# Patient Record
Sex: Female | Born: 1965 | Race: White | Hispanic: No | Marital: Married | Smoking: Never smoker
Health system: Southern US, Community
[De-identification: ages and names within clinical notes are randomized; demographics above are authoritative.]

## PROBLEM LIST (undated history)

## (undated) DIAGNOSIS — F32A Depression, unspecified: Secondary | ICD-10-CM

## (undated) DIAGNOSIS — K219 Gastro-esophageal reflux disease without esophagitis: Secondary | ICD-10-CM

## (undated) DIAGNOSIS — Z86010 Personal history of colonic polyps: Secondary | ICD-10-CM

## (undated) DIAGNOSIS — F419 Anxiety disorder, unspecified: Secondary | ICD-10-CM

## (undated) DIAGNOSIS — E785 Hyperlipidemia, unspecified: Secondary | ICD-10-CM

## (undated) DIAGNOSIS — T7840XA Allergy, unspecified, initial encounter: Secondary | ICD-10-CM

## (undated) DIAGNOSIS — S2222XA Fracture of body of sternum, initial encounter for closed fracture: Secondary | ICD-10-CM

## (undated) DIAGNOSIS — F329 Major depressive disorder, single episode, unspecified: Secondary | ICD-10-CM

## (undated) DIAGNOSIS — C801 Malignant (primary) neoplasm, unspecified: Secondary | ICD-10-CM

## (undated) HISTORY — DX: Allergy, unspecified, initial encounter: T78.40XA

## (undated) HISTORY — DX: Gastro-esophageal reflux disease without esophagitis: K21.9

## (undated) HISTORY — DX: Hyperlipidemia, unspecified: E78.5

## (undated) HISTORY — DX: Depression, unspecified: F32.A

## (undated) HISTORY — DX: Personal history of colonic polyps: Z86.010

## (undated) HISTORY — DX: Malignant (primary) neoplasm, unspecified: C80.1

## (undated) HISTORY — DX: Fracture of body of sternum, initial encounter for closed fracture: S22.22XA

## (undated) HISTORY — DX: Major depressive disorder, single episode, unspecified: F32.9

## (undated) HISTORY — DX: Anxiety disorder, unspecified: F41.9

---

## 2003-01-06 DIAGNOSIS — C801 Malignant (primary) neoplasm, unspecified: Secondary | ICD-10-CM

## 2003-01-06 HISTORY — PX: MELANOMA EXCISION: SHX5266

## 2003-01-06 HISTORY — DX: Malignant (primary) neoplasm, unspecified: C80.1

## 2003-04-23 ENCOUNTER — Ambulatory Visit (HOSPITAL_COMMUNITY): Admission: RE | Admit: 2003-04-23 | Discharge: 2003-04-23 | Payer: Self-pay | Admitting: General Surgery

## 2003-05-01 ENCOUNTER — Encounter (INDEPENDENT_AMBULATORY_CARE_PROVIDER_SITE_OTHER): Payer: Self-pay | Admitting: *Deleted

## 2003-05-01 ENCOUNTER — Ambulatory Visit (HOSPITAL_BASED_OUTPATIENT_CLINIC_OR_DEPARTMENT_OTHER): Admission: RE | Admit: 2003-05-01 | Discharge: 2003-05-01 | Payer: Self-pay | Admitting: General Surgery

## 2003-05-01 ENCOUNTER — Ambulatory Visit (HOSPITAL_COMMUNITY): Admission: RE | Admit: 2003-05-01 | Discharge: 2003-05-01 | Payer: Self-pay | Admitting: General Surgery

## 2004-10-07 ENCOUNTER — Encounter: Admission: RE | Admit: 2004-10-07 | Discharge: 2004-10-07 | Payer: Self-pay | Admitting: Obstetrics & Gynecology

## 2006-03-10 ENCOUNTER — Encounter: Admission: RE | Admit: 2006-03-10 | Discharge: 2006-03-10 | Payer: Self-pay | Admitting: Obstetrics & Gynecology

## 2007-02-22 ENCOUNTER — Encounter: Admission: RE | Admit: 2007-02-22 | Discharge: 2007-02-22 | Payer: Self-pay | Admitting: Obstetrics & Gynecology

## 2007-04-08 ENCOUNTER — Encounter: Admission: RE | Admit: 2007-04-08 | Discharge: 2007-04-08 | Payer: Self-pay | Admitting: Obstetrics & Gynecology

## 2007-08-05 ENCOUNTER — Ambulatory Visit (HOSPITAL_COMMUNITY): Admission: RE | Admit: 2007-08-05 | Discharge: 2007-08-05 | Payer: Self-pay | Admitting: Obstetrics and Gynecology

## 2008-04-09 ENCOUNTER — Encounter: Admission: RE | Admit: 2008-04-09 | Discharge: 2008-04-09 | Payer: Self-pay | Admitting: Obstetrics & Gynecology

## 2009-01-05 HISTORY — PX: BREAST BIOPSY: SHX20

## 2009-04-15 ENCOUNTER — Encounter: Admission: RE | Admit: 2009-04-15 | Discharge: 2009-04-15 | Payer: Self-pay | Admitting: Obstetrics & Gynecology

## 2009-04-24 ENCOUNTER — Encounter: Admission: RE | Admit: 2009-04-24 | Discharge: 2009-04-24 | Payer: Self-pay | Admitting: Obstetrics & Gynecology

## 2010-03-24 ENCOUNTER — Other Ambulatory Visit: Payer: Self-pay | Admitting: Family Medicine

## 2010-03-24 DIAGNOSIS — Z1231 Encounter for screening mammogram for malignant neoplasm of breast: Secondary | ICD-10-CM

## 2010-04-17 ENCOUNTER — Ambulatory Visit: Payer: Self-pay

## 2010-04-22 ENCOUNTER — Ambulatory Visit: Payer: Self-pay

## 2010-04-29 ENCOUNTER — Ambulatory Visit
Admission: RE | Admit: 2010-04-29 | Discharge: 2010-04-29 | Disposition: A | Discharge status: Home / Self Care | Payer: BC Managed Care – PPO | Source: Ambulatory Visit | Attending: Family Medicine | Admitting: Family Medicine

## 2010-04-29 DIAGNOSIS — Z1231 Encounter for screening mammogram for malignant neoplasm of breast: Secondary | ICD-10-CM

## 2010-05-23 NOTE — Op Note (Signed)
NAME:  Leslie Wu, Leslie Wu                           ACCOUNT NO.:  [id number]   MEDICAL RECORD NO.:  [id number]                   PATIENT TYPE:  AMB   LOCATION:  DSC                                  FACILITY:  [facility]   PHYSICIAN:  Rose Phi. Maple Hudson, M.D.                DATE OF BIRTH:  1965-08-23   DATE OF PROCEDURE:  DATE OF DISCHARGE:                                 OPERATIVE REPORT   PREOPERATIVE DIAGNOSIS:  Melanoma of the right upper arm.   POSTOPERATIVE DIAGNOSIS:  Melanoma of the right upper arm.   OPERATION:  1. Blue dye injection.  2. Right axillary sentinel lymph node biopsy.  3. Wide excision of melanoma with primary closure.   SURGEON:  Rose Phi. Maple Hudson, M.D.   ANESTHESIA:  General.   OPERATIVE PROCEDURE:  After suitable general anesthesia was induced, she was  placed in the supine position with the arm extended on the arm board.  Prior  to coming to the operating room, 0.5 mCi of technetium sulfur colloid had  been injected around the melanoma in her right upper arm.   I then injected almost a full milliliter of Lymphazurin blue intradermally  around the melanoma.   After prepping and draping, a short right axillary incision was made with  dissection through the subcutaneous tissue to the clavipectoral fascia.  I  found two hot but without blue dye in them lymph nodes, and then adjacent to  that was a blue and hot lymph node, which I think represents the primary  sentinel node.  All three nodes were excised for evaluation by the  pathologist.  There were no other palpable blue or hot nodes in the axilla.  That incision was then closed with 3-0 Vicryl and subcuticular 4-0 Monocryl  and Steri-Strips.   I then turned the attention to the melanoma in the right upper arm, and an  elliptical incision vertically oriented was then outlined and with a 1 cm  margin around the edges of the biopsy site.  The incision was then made and  this area excised down to the fascia.  We then  mobilized the skin flaps to  be able to close them without any tension.  Hemostasis obtained with the  cautery.  The defect was 9 x 4.5 cm.   It was then closed in two layers of 3-0 Vicryl and interrupted 4-0 nylon  sutures.  Dressings were then applied and the patient transferred to the  recovery room in satisfactory condition, having tolerated the procedure  well.                                               Rose Phi. Maple Hudson, M.D.    PRY/MEDQ  D:  05/01/2003  T:  05/01/2003  Job:  [id number] 

## 2011-03-30 ENCOUNTER — Other Ambulatory Visit: Payer: Self-pay | Admitting: Family Medicine

## 2011-03-30 DIAGNOSIS — Z1231 Encounter for screening mammogram for malignant neoplasm of breast: Secondary | ICD-10-CM

## 2011-04-02 ENCOUNTER — Other Ambulatory Visit: Payer: Self-pay

## 2011-04-30 ENCOUNTER — Ambulatory Visit: Payer: BC Managed Care – PPO

## 2011-08-04 ENCOUNTER — Ambulatory Visit
Admission: RE | Admit: 2011-08-04 | Discharge: 2011-08-04 | Disposition: A | Discharge status: Home / Self Care | Payer: BC Managed Care – PPO | Source: Ambulatory Visit | Attending: Family Medicine | Admitting: Family Medicine

## 2011-08-04 DIAGNOSIS — Z1231 Encounter for screening mammogram for malignant neoplasm of breast: Secondary | ICD-10-CM

## 2012-05-10 ENCOUNTER — Other Ambulatory Visit: Payer: Self-pay | Admitting: Dermatology

## 2012-07-19 ENCOUNTER — Other Ambulatory Visit: Payer: Self-pay

## 2012-07-19 DIAGNOSIS — Z1231 Encounter for screening mammogram for malignant neoplasm of breast: Secondary | ICD-10-CM

## 2012-08-17 ENCOUNTER — Ambulatory Visit: Payer: BC Managed Care – PPO

## 2012-08-24 ENCOUNTER — Ambulatory Visit: Payer: BC Managed Care – PPO

## 2012-09-13 ENCOUNTER — Ambulatory Visit: Payer: BC Managed Care – PPO

## 2012-09-27 ENCOUNTER — Ambulatory Visit
Admission: RE | Admit: 2012-09-27 | Discharge: 2012-09-27 | Disposition: A | Discharge status: Home / Self Care | Payer: BC Managed Care – PPO | Source: Ambulatory Visit

## 2012-09-27 DIAGNOSIS — Z1231 Encounter for screening mammogram for malignant neoplasm of breast: Secondary | ICD-10-CM

## 2013-02-01 ENCOUNTER — Other Ambulatory Visit: Payer: Self-pay

## 2013-03-01 ENCOUNTER — Other Ambulatory Visit: Payer: Self-pay

## 2013-10-10 ENCOUNTER — Ambulatory Visit
Admission: RE | Admit: 2013-10-10 | Discharge: 2013-10-10 | Disposition: A | Discharge status: Home / Self Care | Payer: BC Managed Care – PPO | Source: Ambulatory Visit | Attending: Family Medicine | Admitting: Family Medicine

## 2013-10-10 ENCOUNTER — Other Ambulatory Visit: Payer: Self-pay | Admitting: Family Medicine

## 2013-10-10 DIAGNOSIS — M545 Low back pain: Secondary | ICD-10-CM

## 2013-10-11 ENCOUNTER — Other Ambulatory Visit: Payer: Self-pay

## 2013-10-11 DIAGNOSIS — Z1239 Encounter for other screening for malignant neoplasm of breast: Secondary | ICD-10-CM

## 2013-10-24 ENCOUNTER — Ambulatory Visit: Payer: BC Managed Care – PPO

## 2013-11-08 ENCOUNTER — Ambulatory Visit: Payer: BC Managed Care – PPO

## 2013-11-20 ENCOUNTER — Other Ambulatory Visit: Payer: Self-pay

## 2013-11-20 DIAGNOSIS — Z1231 Encounter for screening mammogram for malignant neoplasm of breast: Secondary | ICD-10-CM

## 2013-11-22 ENCOUNTER — Ambulatory Visit
Admission: RE | Admit: 2013-11-22 | Discharge: 2013-11-22 | Disposition: A | Discharge status: Home / Self Care | Payer: BC Managed Care – PPO | Source: Ambulatory Visit

## 2013-11-22 ENCOUNTER — Encounter (INDEPENDENT_AMBULATORY_CARE_PROVIDER_SITE_OTHER): Payer: Self-pay

## 2013-11-22 DIAGNOSIS — Z1231 Encounter for screening mammogram for malignant neoplasm of breast: Secondary | ICD-10-CM

## 2013-12-06 ENCOUNTER — Other Ambulatory Visit: Payer: Self-pay | Admitting: Family Medicine

## 2013-12-06 DIAGNOSIS — M5136 Other intervertebral disc degeneration, lumbar region: Secondary | ICD-10-CM

## 2013-12-13 ENCOUNTER — Other Ambulatory Visit: Payer: BC Managed Care – PPO

## 2013-12-14 ENCOUNTER — Ambulatory Visit
Admission: RE | Admit: 2013-12-14 | Discharge: 2013-12-14 | Disposition: A | Discharge status: Home / Self Care | Payer: BC Managed Care – PPO | Source: Ambulatory Visit | Attending: Family Medicine | Admitting: Family Medicine

## 2013-12-14 DIAGNOSIS — M5136 Other intervertebral disc degeneration, lumbar region: Secondary | ICD-10-CM

## 2013-12-20 ENCOUNTER — Other Ambulatory Visit: Payer: Self-pay | Admitting: Family Medicine

## 2013-12-20 DIAGNOSIS — G8929 Other chronic pain: Secondary | ICD-10-CM

## 2013-12-20 DIAGNOSIS — M533 Sacrococcygeal disorders, not elsewhere classified: Principal | ICD-10-CM

## 2013-12-26 ENCOUNTER — Ambulatory Visit
Admission: RE | Admit: 2013-12-26 | Discharge: 2013-12-26 | Disposition: A | Discharge status: Home / Self Care | Payer: BC Managed Care – PPO | Source: Ambulatory Visit | Attending: Family Medicine | Admitting: Family Medicine

## 2013-12-26 DIAGNOSIS — M533 Sacrococcygeal disorders, not elsewhere classified: Principal | ICD-10-CM

## 2013-12-26 DIAGNOSIS — G8929 Other chronic pain: Secondary | ICD-10-CM

## 2013-12-26 MED ORDER — METHYLPREDNISOLONE ACETATE 40 MG/ML INJ SUSP (RADIOLOG
120.0000 mg | Freq: Once | INTRAMUSCULAR | Status: AC
  Administered 2013-12-26: 120 mg via INTRA_ARTICULAR

## 2013-12-26 MED ORDER — IOHEXOL 180 MG/ML  SOLN
1.0000 mL | Freq: Once | INTRAMUSCULAR | Status: AC | PRN
  Administered 2013-12-26: 1 mL via INTRA_ARTICULAR

## 2014-02-13 ENCOUNTER — Other Ambulatory Visit: Payer: Self-pay | Admitting: Family Medicine

## 2014-02-13 DIAGNOSIS — M533 Sacrococcygeal disorders, not elsewhere classified: Principal | ICD-10-CM

## 2014-02-13 DIAGNOSIS — G8929 Other chronic pain: Secondary | ICD-10-CM

## 2014-03-01 ENCOUNTER — Ambulatory Visit
Admission: RE | Admit: 2014-03-01 | Discharge: 2014-03-01 | Disposition: A | Discharge status: Home / Self Care | Payer: BLUE CROSS/BLUE SHIELD | Source: Ambulatory Visit | Attending: Family Medicine | Admitting: Family Medicine

## 2014-03-01 DIAGNOSIS — M533 Sacrococcygeal disorders, not elsewhere classified: Principal | ICD-10-CM

## 2014-03-01 DIAGNOSIS — G8929 Other chronic pain: Secondary | ICD-10-CM

## 2014-03-01 MED ORDER — METHYLPREDNISOLONE ACETATE 40 MG/ML INJ SUSP (RADIOLOG
120.0000 mg | Freq: Once | INTRAMUSCULAR | Status: AC
  Administered 2014-03-01: 120 mg via INTRA_ARTICULAR

## 2014-03-01 MED ORDER — IOHEXOL 180 MG/ML  SOLN
1.0000 mL | Freq: Once | INTRAMUSCULAR | Status: AC | PRN
  Administered 2014-03-01: 1 mL via INTRA_ARTICULAR

## 2014-09-24 ENCOUNTER — Other Ambulatory Visit: Payer: Self-pay | Admitting: Family Medicine

## 2014-09-24 ENCOUNTER — Ambulatory Visit
Admission: RE | Admit: 2014-09-24 | Discharge: 2014-09-24 | Disposition: A | Discharge status: Home / Self Care | Payer: BLUE CROSS/BLUE SHIELD | Source: Ambulatory Visit | Attending: Family Medicine | Admitting: Family Medicine

## 2014-09-24 DIAGNOSIS — R609 Edema, unspecified: Secondary | ICD-10-CM

## 2014-09-24 DIAGNOSIS — R52 Pain, unspecified: Secondary | ICD-10-CM

## 2014-10-23 ENCOUNTER — Other Ambulatory Visit: Payer: Self-pay

## 2014-10-23 DIAGNOSIS — Z1231 Encounter for screening mammogram for malignant neoplasm of breast: Secondary | ICD-10-CM

## 2014-11-01 ENCOUNTER — Other Ambulatory Visit: Payer: Self-pay | Admitting: Family Medicine

## 2014-11-01 DIAGNOSIS — G8929 Other chronic pain: Secondary | ICD-10-CM

## 2014-11-01 DIAGNOSIS — M533 Sacrococcygeal disorders, not elsewhere classified: Principal | ICD-10-CM

## 2014-11-14 ENCOUNTER — Ambulatory Visit
Admission: RE | Admit: 2014-11-14 | Discharge: 2014-11-14 | Disposition: A | Discharge status: Home / Self Care | Payer: BLUE CROSS/BLUE SHIELD | Source: Ambulatory Visit | Attending: Family Medicine | Admitting: Family Medicine

## 2014-11-14 DIAGNOSIS — M533 Sacrococcygeal disorders, not elsewhere classified: Principal | ICD-10-CM

## 2014-11-14 DIAGNOSIS — G8929 Other chronic pain: Secondary | ICD-10-CM

## 2014-11-14 MED ORDER — METHYLPREDNISOLONE ACETATE 40 MG/ML INJ SUSP (RADIOLOG
120.0000 mg | Freq: Once | INTRAMUSCULAR | Status: AC
  Administered 2014-11-14: 120 mg via INTRA_ARTICULAR

## 2014-11-14 MED ORDER — IOHEXOL 180 MG/ML  SOLN
1.0000 mL | Freq: Once | INTRAMUSCULAR | Status: DC | PRN
  Administered 2014-11-14: 1 mL via INTRA_ARTICULAR

## 2014-11-14 NOTE — Discharge Instructions (Signed)

## 2014-11-27 ENCOUNTER — Ambulatory Visit: Payer: BLUE CROSS/BLUE SHIELD

## 2014-12-19 ENCOUNTER — Ambulatory Visit: Payer: BLUE CROSS/BLUE SHIELD

## 2014-12-26 ENCOUNTER — Ambulatory Visit: Payer: BLUE CROSS/BLUE SHIELD

## 2015-01-10 ENCOUNTER — Ambulatory Visit
Admission: RE | Admit: 2015-01-10 | Discharge: 2015-01-10 | Disposition: A | Discharge status: Home / Self Care | Payer: BLUE CROSS/BLUE SHIELD | Source: Ambulatory Visit

## 2015-01-10 DIAGNOSIS — Z1231 Encounter for screening mammogram for malignant neoplasm of breast: Secondary | ICD-10-CM

## 2015-01-11 ENCOUNTER — Other Ambulatory Visit: Payer: Self-pay | Admitting: Family Medicine

## 2015-01-11 DIAGNOSIS — R928 Other abnormal and inconclusive findings on diagnostic imaging of breast: Secondary | ICD-10-CM

## 2015-01-14 ENCOUNTER — Other Ambulatory Visit: Payer: BLUE CROSS/BLUE SHIELD

## 2015-01-17 ENCOUNTER — Ambulatory Visit
Admission: RE | Admit: 2015-01-17 | Discharge: 2015-01-17 | Disposition: A | Discharge status: Home / Self Care | Payer: BLUE CROSS/BLUE SHIELD | Source: Ambulatory Visit | Attending: Family Medicine | Admitting: Family Medicine

## 2015-01-17 DIAGNOSIS — R928 Other abnormal and inconclusive findings on diagnostic imaging of breast: Secondary | ICD-10-CM

## 2015-04-03 ENCOUNTER — Other Ambulatory Visit: Payer: Self-pay | Admitting: Family Medicine

## 2015-04-03 DIAGNOSIS — G8929 Other chronic pain: Secondary | ICD-10-CM

## 2015-04-03 DIAGNOSIS — M533 Sacrococcygeal disorders, not elsewhere classified: Principal | ICD-10-CM

## 2015-04-05 ENCOUNTER — Ambulatory Visit
Admission: RE | Admit: 2015-04-05 | Discharge: 2015-04-05 | Disposition: A | Discharge status: Home / Self Care | Payer: BLUE CROSS/BLUE SHIELD | Source: Ambulatory Visit | Attending: Family Medicine | Admitting: Family Medicine

## 2015-04-05 DIAGNOSIS — M533 Sacrococcygeal disorders, not elsewhere classified: Principal | ICD-10-CM

## 2015-04-05 DIAGNOSIS — G8929 Other chronic pain: Secondary | ICD-10-CM

## 2015-04-05 MED ORDER — IOHEXOL 180 MG/ML  SOLN
1.0000 mL | Freq: Once | INTRAMUSCULAR | Status: AC | PRN
  Administered 2015-04-05: 1 mL via INTRA_ARTICULAR

## 2015-04-05 MED ORDER — METHYLPREDNISOLONE ACETATE 40 MG/ML INJ SUSP (RADIOLOG
120.0000 mg | Freq: Once | INTRAMUSCULAR | Status: AC
  Administered 2015-04-05: 120 mg via INTRA_ARTICULAR

## 2015-04-05 NOTE — Discharge Instructions (Signed)

## 2015-04-24 ENCOUNTER — Other Ambulatory Visit: Payer: Self-pay | Admitting: Family Medicine

## 2015-04-24 DIAGNOSIS — M533 Sacrococcygeal disorders, not elsewhere classified: Principal | ICD-10-CM

## 2015-04-24 DIAGNOSIS — G8929 Other chronic pain: Secondary | ICD-10-CM

## 2015-05-07 ENCOUNTER — Other Ambulatory Visit: Payer: Self-pay | Admitting: Family Medicine

## 2015-05-07 ENCOUNTER — Other Ambulatory Visit: Payer: BLUE CROSS/BLUE SHIELD

## 2015-05-07 DIAGNOSIS — R1031 Right lower quadrant pain: Secondary | ICD-10-CM

## 2015-05-10 ENCOUNTER — Other Ambulatory Visit: Payer: BLUE CROSS/BLUE SHIELD

## 2015-07-10 ENCOUNTER — Ambulatory Visit
Admission: RE | Admit: 2015-07-10 | Discharge: 2015-07-10 | Disposition: A | Discharge status: Home / Self Care | Payer: BLUE CROSS/BLUE SHIELD | Source: Ambulatory Visit | Attending: Family Medicine | Admitting: Family Medicine

## 2015-07-10 ENCOUNTER — Other Ambulatory Visit: Payer: Self-pay | Admitting: Family Medicine

## 2015-08-28 ENCOUNTER — Ambulatory Visit
Admission: RE | Admit: 2015-08-28 | Discharge: 2015-08-28 | Disposition: A | Discharge status: Home / Self Care | Payer: BLUE CROSS/BLUE SHIELD | Source: Ambulatory Visit | Attending: Family Medicine | Admitting: Family Medicine

## 2015-08-28 ENCOUNTER — Other Ambulatory Visit: Payer: Self-pay | Admitting: Family Medicine

## 2015-08-28 DIAGNOSIS — M25532 Pain in left wrist: Secondary | ICD-10-CM

## 2015-08-30 ENCOUNTER — Other Ambulatory Visit: Payer: Self-pay | Admitting: Family Medicine

## 2015-08-30 DIAGNOSIS — M533 Sacrococcygeal disorders, not elsewhere classified: Secondary | ICD-10-CM

## 2015-09-16 ENCOUNTER — Ambulatory Visit
Admission: RE | Admit: 2015-09-16 | Discharge: 2015-09-16 | Disposition: A | Discharge status: Home / Self Care | Payer: BLUE CROSS/BLUE SHIELD | Source: Ambulatory Visit | Attending: Family Medicine | Admitting: Family Medicine

## 2015-09-16 DIAGNOSIS — M533 Sacrococcygeal disorders, not elsewhere classified: Secondary | ICD-10-CM

## 2015-09-16 MED ORDER — METHYLPREDNISOLONE ACETATE 40 MG/ML INJ SUSP (RADIOLOG
120.0000 mg | Freq: Once | INTRAMUSCULAR | Status: AC
  Administered 2015-09-16: 120 mg via INTRA_ARTICULAR

## 2015-09-16 MED ORDER — IOPAMIDOL (ISOVUE-M 200) INJECTION 41%
1.0000 mL | Freq: Once | INTRAMUSCULAR | Status: AC
  Administered 2015-09-16: 1 mL via INTRA_ARTICULAR

## 2015-09-16 NOTE — Discharge Instructions (Signed)

## 2015-10-11 ENCOUNTER — Other Ambulatory Visit: Payer: Self-pay | Admitting: Family Medicine

## 2015-10-11 ENCOUNTER — Ambulatory Visit
Admission: RE | Admit: 2015-10-11 | Discharge: 2015-10-11 | Disposition: A | Discharge status: Home / Self Care | Payer: BLUE CROSS/BLUE SHIELD | Source: Ambulatory Visit | Attending: Family Medicine | Admitting: Family Medicine

## 2015-10-11 DIAGNOSIS — R531 Weakness: Secondary | ICD-10-CM

## 2015-10-11 DIAGNOSIS — R251 Tremor, unspecified: Secondary | ICD-10-CM

## 2015-11-20 ENCOUNTER — Other Ambulatory Visit: Payer: Self-pay | Admitting: Family Medicine

## 2015-11-20 ENCOUNTER — Ambulatory Visit
Admission: RE | Admit: 2015-11-20 | Discharge: 2015-11-20 | Disposition: A | Discharge status: Home / Self Care | Payer: BLUE CROSS/BLUE SHIELD | Source: Ambulatory Visit | Attending: Family Medicine | Admitting: Family Medicine

## 2015-11-20 DIAGNOSIS — S2220XA Unspecified fracture of sternum, initial encounter for closed fracture: Secondary | ICD-10-CM

## 2015-11-25 ENCOUNTER — Ambulatory Visit (INDEPENDENT_AMBULATORY_CARE_PROVIDER_SITE_OTHER): Payer: BLUE CROSS/BLUE SHIELD | Admitting: Orthopaedic Surgery

## 2015-11-25 ENCOUNTER — Encounter (INDEPENDENT_AMBULATORY_CARE_PROVIDER_SITE_OTHER): Payer: Self-pay | Admitting: Orthopaedic Surgery

## 2015-11-25 VITALS — BP 123/78 | HR 79 | Resp 14 | Ht 62.0 in | Wt 154.0 lb

## 2015-11-25 DIAGNOSIS — R0789 Other chest pain: Secondary | ICD-10-CM | POA: Diagnosis not present
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
## 2015-12-04 ENCOUNTER — Ambulatory Visit
Admission: RE | Admit: 2015-12-04 | Discharge: 2015-12-04 | Disposition: A | Discharge status: Home / Self Care | Payer: BLUE CROSS/BLUE SHIELD | Source: Ambulatory Visit | Attending: Orthopaedic Surgery | Admitting: Orthopaedic Surgery

## 2015-12-04 DIAGNOSIS — R0789 Other chest pain: Secondary | ICD-10-CM

## 2015-12-05 ENCOUNTER — Other Ambulatory Visit (INDEPENDENT_AMBULATORY_CARE_PROVIDER_SITE_OTHER): Payer: Self-pay

## 2015-12-05 DIAGNOSIS — M8448XK Pathological fracture, other site, subsequent encounter for fracture with nonunion: Secondary | ICD-10-CM

## 2015-12-05 NOTE — Progress Notes (Signed)
Need to refer to chest surgeons with non union of sternum via CT scan

## 2015-12-12 ENCOUNTER — Ambulatory Visit (INDEPENDENT_AMBULATORY_CARE_PROVIDER_SITE_OTHER): Payer: BLUE CROSS/BLUE SHIELD | Admitting: Orthopaedic Surgery

## 2015-12-12 ENCOUNTER — Encounter (INDEPENDENT_AMBULATORY_CARE_PROVIDER_SITE_OTHER): Payer: Self-pay | Admitting: Orthopaedic Surgery

## 2015-12-12 VITALS — BP 125/79 | HR 90 | Ht 62.0 in | Wt 154.0 lb

## 2015-12-12 DIAGNOSIS — S2222XK Fracture of body of sternum, subsequent encounter for fracture with nonunion: Secondary | ICD-10-CM | POA: Diagnosis not present

## 2015-12-12 DIAGNOSIS — S2222XA Fracture of body of sternum, initial encounter for closed fracture: Secondary | ICD-10-CM

## 2015-12-12 HISTORY — DX: Fracture of body of sternum, initial encounter for closed fracture: S22.22XA
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
## 2015-12-17 ENCOUNTER — Other Ambulatory Visit: Payer: Self-pay | Admitting: Family Medicine

## 2015-12-17 DIAGNOSIS — M533 Sacrococcygeal disorders, not elsewhere classified: Secondary | ICD-10-CM

## 2015-12-18 ENCOUNTER — Other Ambulatory Visit: Payer: Self-pay | Admitting: Family Medicine

## 2015-12-18 DIAGNOSIS — Z1231 Encounter for screening mammogram for malignant neoplasm of breast: Secondary | ICD-10-CM

## 2015-12-20 ENCOUNTER — Telehealth (INDEPENDENT_AMBULATORY_CARE_PROVIDER_SITE_OTHER): Payer: Self-pay | Admitting: Orthopaedic Surgery

## 2015-12-20 NOTE — Telephone Encounter (Signed)
Patient is calling about the referral that was supposed to be made to a group of doctors at [facility] for her broken sternum. Has this referral been sent? Patient has not heard anything from them about scheduling an appointment.

## 2015-12-24 NOTE — Telephone Encounter (Signed)
Ivin Booty called pt

## 2015-12-25 ENCOUNTER — Ambulatory Visit
Admission: RE | Admit: 2015-12-25 | Discharge: 2015-12-25 | Disposition: A | Discharge status: Home / Self Care | Payer: BLUE CROSS/BLUE SHIELD | Source: Ambulatory Visit | Attending: Family Medicine | Admitting: Family Medicine

## 2015-12-25 DIAGNOSIS — M533 Sacrococcygeal disorders, not elsewhere classified: Secondary | ICD-10-CM

## 2015-12-25 MED ORDER — IOPAMIDOL (ISOVUE-M 200) INJECTION 41%
1.0000 mL | Freq: Once | INTRAMUSCULAR | Status: AC
  Administered 2015-12-25: 1 mL via INTRA_ARTICULAR

## 2015-12-25 MED ORDER — METHYLPREDNISOLONE ACETATE 40 MG/ML INJ SUSP (RADIOLOG
120.0000 mg | Freq: Once | INTRAMUSCULAR | Status: AC
  Administered 2015-12-25: 120 mg via INTRA_ARTICULAR

## 2016-01-07 ENCOUNTER — Other Ambulatory Visit: Payer: Self-pay | Admitting: Thoracic Surgery (Cardiothoracic Vascular Surgery)

## 2016-01-07 ENCOUNTER — Encounter: Payer: Self-pay | Admitting: Thoracic Surgery (Cardiothoracic Vascular Surgery)

## 2016-01-07 ENCOUNTER — Institutional Professional Consult (permissible substitution) (INDEPENDENT_AMBULATORY_CARE_PROVIDER_SITE_OTHER): Payer: BLUE CROSS/BLUE SHIELD | Admitting: Thoracic Surgery (Cardiothoracic Vascular Surgery)

## 2016-01-07 VITALS — BP 152/99 | HR 66 | Resp 16 | Ht 63.0 in | Wt 160.0 lb

## 2016-01-07 DIAGNOSIS — S2220XD Unspecified fracture of sternum, subsequent encounter for fracture with routine healing: Secondary | ICD-10-CM

## 2016-01-07 DIAGNOSIS — S2223XK Sternal manubrial dissociation, subsequent encounter for fracture with nonunion: Secondary | ICD-10-CM | POA: Diagnosis not present

## 2016-01-07 DIAGNOSIS — S2220XA Unspecified fracture of sternum, initial encounter for closed fracture: Secondary | ICD-10-CM
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
## 2016-01-13 ENCOUNTER — Ambulatory Visit: Payer: BLUE CROSS/BLUE SHIELD

## 2016-02-11 ENCOUNTER — Other Ambulatory Visit: Payer: BLUE CROSS/BLUE SHIELD

## 2016-02-11 ENCOUNTER — Ambulatory Visit: Payer: BLUE CROSS/BLUE SHIELD | Admitting: Thoracic Surgery (Cardiothoracic Vascular Surgery)

## 2016-03-10 ENCOUNTER — Ambulatory Visit
Admission: RE | Admit: 2016-03-10 | Discharge: 2016-03-10 | Disposition: A | Discharge status: Home / Self Care | Payer: BLUE CROSS/BLUE SHIELD | Source: Ambulatory Visit | Attending: Thoracic Surgery (Cardiothoracic Vascular Surgery) | Admitting: Thoracic Surgery (Cardiothoracic Vascular Surgery)

## 2016-03-10 ENCOUNTER — Encounter: Payer: Self-pay | Admitting: Thoracic Surgery (Cardiothoracic Vascular Surgery)

## 2016-03-10 ENCOUNTER — Ambulatory Visit (INDEPENDENT_AMBULATORY_CARE_PROVIDER_SITE_OTHER): Payer: BLUE CROSS/BLUE SHIELD | Admitting: Thoracic Surgery (Cardiothoracic Vascular Surgery)

## 2016-03-10 VITALS — BP 124/85 | HR 74 | Resp 20 | Ht 62.0 in | Wt 154.0 lb

## 2016-03-10 DIAGNOSIS — S2223XK Sternal manubrial dissociation, subsequent encounter for fracture with nonunion: Secondary | ICD-10-CM

## 2016-03-10 DIAGNOSIS — S2220XA Unspecified fracture of sternum, initial encounter for closed fracture: Secondary | ICD-10-CM
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
## 2016-04-05 HISTORY — PX: BREAST BIOPSY: SHX20

## 2016-04-27 ENCOUNTER — Ambulatory Visit: Payer: BLUE CROSS/BLUE SHIELD

## 2016-05-01 ENCOUNTER — Ambulatory Visit
Admission: RE | Admit: 2016-05-01 | Discharge: 2016-05-01 | Disposition: A | Discharge status: Home / Self Care | Payer: BLUE CROSS/BLUE SHIELD | Source: Ambulatory Visit | Attending: Family Medicine | Admitting: Family Medicine

## 2016-05-01 DIAGNOSIS — Z1231 Encounter for screening mammogram for malignant neoplasm of breast: Secondary | ICD-10-CM

## 2016-05-04 ENCOUNTER — Other Ambulatory Visit: Payer: Self-pay | Admitting: Family Medicine

## 2016-05-04 DIAGNOSIS — R928 Other abnormal and inconclusive findings on diagnostic imaging of breast: Secondary | ICD-10-CM

## 2016-05-06 ENCOUNTER — Ambulatory Visit
Admission: RE | Admit: 2016-05-06 | Discharge: 2016-05-06 | Disposition: A | Discharge status: Home / Self Care | Payer: BLUE CROSS/BLUE SHIELD | Source: Ambulatory Visit | Attending: Family Medicine | Admitting: Family Medicine

## 2016-05-06 ENCOUNTER — Other Ambulatory Visit: Payer: Self-pay | Admitting: Family Medicine

## 2016-05-06 DIAGNOSIS — N6320 Unspecified lump in the left breast, unspecified quadrant: Secondary | ICD-10-CM

## 2016-05-06 DIAGNOSIS — R928 Other abnormal and inconclusive findings on diagnostic imaging of breast: Secondary | ICD-10-CM

## 2016-05-08 ENCOUNTER — Other Ambulatory Visit: Payer: Self-pay | Admitting: Family Medicine

## 2016-05-08 ENCOUNTER — Ambulatory Visit
Admission: RE | Admit: 2016-05-08 | Discharge: 2016-05-08 | Disposition: A | Discharge status: Home / Self Care | Payer: BLUE CROSS/BLUE SHIELD | Source: Ambulatory Visit | Attending: Family Medicine | Admitting: Family Medicine

## 2016-05-08 DIAGNOSIS — N6320 Unspecified lump in the left breast, unspecified quadrant: Secondary | ICD-10-CM

## 2016-05-12 ENCOUNTER — Other Ambulatory Visit: Payer: Self-pay | Admitting: Family Medicine

## 2016-05-12 ENCOUNTER — Encounter: Payer: Self-pay | Admitting: Internal Medicine

## 2016-05-12 DIAGNOSIS — M533 Sacrococcygeal disorders, not elsewhere classified: Secondary | ICD-10-CM

## 2016-05-13 ENCOUNTER — Other Ambulatory Visit: Payer: Self-pay | Admitting: Family Medicine

## 2016-05-13 ENCOUNTER — Ambulatory Visit
Admission: RE | Admit: 2016-05-13 | Discharge: 2016-05-13 | Disposition: A | Discharge status: Home / Self Care | Payer: BLUE CROSS/BLUE SHIELD | Source: Ambulatory Visit | Attending: Family Medicine | Admitting: Family Medicine

## 2016-05-13 DIAGNOSIS — R609 Edema, unspecified: Secondary | ICD-10-CM

## 2016-05-13 DIAGNOSIS — M25572 Pain in left ankle and joints of left foot: Secondary | ICD-10-CM

## 2016-05-13 DIAGNOSIS — M25562 Pain in left knee: Secondary | ICD-10-CM

## 2016-05-26 ENCOUNTER — Ambulatory Visit
Admission: RE | Admit: 2016-05-26 | Discharge: 2016-05-26 | Disposition: A | Discharge status: Home / Self Care | Payer: BLUE CROSS/BLUE SHIELD | Source: Ambulatory Visit | Attending: Family Medicine | Admitting: Family Medicine

## 2016-05-26 DIAGNOSIS — M533 Sacrococcygeal disorders, not elsewhere classified: Secondary | ICD-10-CM

## 2016-05-26 MED ORDER — METHYLPREDNISOLONE ACETATE 40 MG/ML INJ SUSP (RADIOLOG
120.0000 mg | Freq: Once | INTRAMUSCULAR | Status: AC
  Administered 2016-05-26: 120 mg via INTRA_ARTICULAR

## 2016-05-26 MED ORDER — IOPAMIDOL (ISOVUE-M 200) INJECTION 41%
1.0000 mL | Freq: Once | INTRAMUSCULAR | Status: AC
  Administered 2016-05-26: 1 mL via INTRA_ARTICULAR

## 2016-05-26 NOTE — Discharge Instructions (Signed)

## 2016-06-30 ENCOUNTER — Ambulatory Visit (AMBULATORY_SURGERY_CENTER): Payer: Self-pay | Admitting: *Deleted

## 2016-06-30 ENCOUNTER — Encounter: Payer: Self-pay | Admitting: Internal Medicine

## 2016-06-30 VITALS — Ht 62.5 in | Wt 151.4 lb

## 2016-06-30 DIAGNOSIS — Z1211 Encounter for screening for malignant neoplasm of colon: Secondary | ICD-10-CM

## 2016-06-30 NOTE — Progress Notes (Signed)
No allergies to eggs or soy. No problems with anesthesia.  Pt given Emmi instructions for colonoscopy  No oxygen use  No diet drug use  

## 2016-07-14 ENCOUNTER — Ambulatory Visit (AMBULATORY_SURGERY_CENTER): Payer: BLUE CROSS/BLUE SHIELD | Admitting: Internal Medicine

## 2016-07-14 ENCOUNTER — Encounter: Payer: Self-pay | Admitting: Internal Medicine

## 2016-07-14 VITALS — BP 110/71 | HR 62 | Temp 98.4°F | Resp 29 | Ht 62.5 in | Wt 151.0 lb

## 2016-07-14 DIAGNOSIS — Z1212 Encounter for screening for malignant neoplasm of rectum: Secondary | ICD-10-CM | POA: Diagnosis not present

## 2016-07-14 DIAGNOSIS — Z1211 Encounter for screening for malignant neoplasm of colon: Secondary | ICD-10-CM

## 2016-07-14 DIAGNOSIS — D124 Benign neoplasm of descending colon: Secondary | ICD-10-CM | POA: Diagnosis not present

## 2016-07-14 MED ORDER — SODIUM CHLORIDE 0.9 % IV SOLN: 500.0000 mL | INTRAVENOUS | Status: AC

## 2016-07-14 NOTE — Progress Notes (Signed)
Pt's states no medical or surgical changes since previsit or office visit. 

## 2016-07-14 NOTE — Patient Instructions (Addendum)
I found and removed one tiny polyp.  All else normal.  I will let you know pathology results and when to have another routine colonoscopy by mail and/or My Chart.  I appreciate the opportunity to care for you. Gatha Mayer, MD, FACG YOU HAD AN ENDOSCOPIC PROCEDURE TODAY AT [facility]:   Refer to the procedure report that was given to you for any specific questions about what was found during the examination.  If the procedure report does not answer your questions, please call your gastroenterologist to clarify.  If you requested that your care partner not be given the details of your procedure findings, then the procedure report has been included in a sealed envelope for you to review at your convenience later.  YOU SHOULD EXPECT: Some feelings of bloating in the abdomen. Passage of more gas than usual.  Walking can help get rid of the air that was put into your GI tract during the procedure and reduce the bloating. If you had a lower endoscopy (such as a colonoscopy or flexible sigmoidoscopy) you may notice spotting of blood in your stool or on the toilet paper. If you underwent a bowel prep for your procedure, you may not have a normal bowel movement for a few days.  Please Note:  You might notice some irritation and congestion in your nose or some drainage.  This is from the oxygen used during your procedure.  There is no need for concern and it should clear up in a day or so.  SYMPTOMS TO REPORT IMMEDIATELY:   Following lower endoscopy (colonoscopy or flexible sigmoidoscopy):  Excessive amounts of blood in the stool  Significant tenderness or worsening of abdominal pains  Swelling of the abdomen that is new, acute  Fever of 100F or higher   For urgent or emergent issues, a gastroenterologist can be reached at any hour by calling [phone].   DIET:  We do recommend a small meal at first, but then you may proceed to your regular diet.  Drink plenty of  fluids but you should avoid alcoholic beverages for 24 hours.  ACTIVITY:  You should plan to take it easy for the rest of today and you should NOT DRIVE or use heavy machinery until tomorrow (because of the sedation medicines used during the test).    FOLLOW UP: Our staff will call the number listed on your records the next business day following your procedure to check on you and address any questions or concerns that you may have regarding the information given to you following your procedure. If we do not reach you, we will leave a message.  However, if you are feeling well and you are not experiencing any problems, there is no need to return our call.  We will assume that you have returned to your regular daily activities without incident.  If any biopsies were taken you will be contacted by phone or by letter within the next 1-3 weeks.  Please call us at [phone] if you have not heard about the biopsies in 3 weeks.    SIGNATURES/CONFIDENTIALITY: You and/or your care partner have signed paperwork which will be entered into your electronic medical record.  These signatures attest to the fact that that the information above on your After Visit Summary has been reviewed and is understood.  Full responsibility of the confidentiality of this discharge information lies with you and/or your care-partner.  Polyp information given.YOU HAD AN ENDOSCOPIC PROCEDURE TODAY AT  [facility]:   Refer to the procedure report that was given to you for any specific questions about what was found during the examination.  If the procedure report does not answer your questions, please call your gastroenterologist to clarify.  If you requested that your care partner not be given the details of your procedure findings, then the procedure report has been included in a sealed envelope for you to review at your convenience later.  YOU SHOULD EXPECT: Some feelings of bloating in the abdomen. Passage of  more gas than usual.  Walking can help get rid of the air that was put into your GI tract during the procedure and reduce the bloating. If you had a lower endoscopy (such as a colonoscopy or flexible sigmoidoscopy) you may notice spotting of blood in your stool or on the toilet paper. If you underwent a bowel prep for your procedure, you may not have a normal bowel movement for a few days.  Please Note:  You might notice some irritation and congestion in your nose or some drainage.  This is from the oxygen used during your procedure.  There is no need for concern and it should clear up in a day or so.  SYMPTOMS TO REPORT IMMEDIATELY:   Following lower endoscopy (colonoscopy or flexible sigmoidoscopy):  Excessive amounts of blood in the stool  Significant tenderness or worsening of abdominal pains  Swelling of the abdomen that is new, acute  Fever of 100F or higher   Following upper endoscopy (EGD)  Vomiting of blood or coffee ground material  New chest pain or pain under the shoulder blades  Painful or persistently difficult swallowing  New shortness of breath  Fever of 100F or higher  Black, tarry-looking stools  For urgent or emergent issues, a gastroenterologist can be reached at any hour by calling [phone].   DIET:  We do recommend a small meal at first, but then you may proceed to your regular diet.  Drink plenty of fluids but you should avoid alcoholic beverages for 24 hours.  ACTIVITY:  You should plan to take it easy for the rest of today and you should NOT DRIVE or use heavy machinery until tomorrow (because of the sedation medicines used during the test).    FOLLOW UP: Our staff will call the number listed on your records the next business day following your procedure to check on you and address any questions or concerns that you may have regarding the information given to you following your procedure. If we do not reach you, we will leave a message.  However, if you  are feeling well and you are not experiencing any problems, there is no need to return our call.  We will assume that you have returned to your regular daily activities without incident.  If any biopsies were taken you will be contacted by phone or by letter within the next 1-3 weeks.  Please call us at [phone] if you have not heard about the biopsies in 3 weeks.    SIGNATURES/CONFIDENTIALITY: You and/or your care partner have signed paperwork which will be entered into your electronic medical record.  These signatures attest to the fact that that the information above on your After Visit Summary has been reviewed and is understood.  Full responsibility of the confidentiality of this discharge information lies with you and/or your care-partner.

## 2016-07-14 NOTE — Progress Notes (Signed)
Alert and oriented x3, pleased with MAC, report to [employer]

## 2016-07-14 NOTE — Op Note (Signed)
[facility] Patient Name: Leslie Wu Procedure Date: 07/14/2016 11:58 AM MRN: [id number] Endoscopist: Gatha Mayer , MD Age: 51 Referring MD:  Date of Birth: 1965/11/12 Gender: Female Account #: [id number] Procedure:                Colonoscopy Indications:              Screening for colorectal malignant neoplasm, This                            is the patient's first colonoscopy Medicines:                Propofol per Anesthesia, Monitored Anesthesia Care Procedure:                Pre-Anesthesia Assessment:                           - Prior to the procedure, a History and Physical                            was performed, and patient medications and                            allergies were reviewed. The patient's tolerance of                            previous anesthesia was also reviewed. The risks                            and benefits of the procedure and the sedation                            options and risks were discussed with the patient.                            All questions were answered, and informed consent                            was obtained. Prior Anticoagulants: The patient has                            taken no previous anticoagulant or antiplatelet                            agents. ASA Grade Assessment: II - A patient with                            mild systemic disease. After reviewing the risks                            and benefits, the patient was deemed in                            satisfactory condition to undergo the procedure.  After obtaining informed consent, the colonoscope                            was passed under direct vision. Throughout the                            procedure, the patient's blood pressure, pulse, and                            oxygen saturations were monitored continuously. The                            Colonoscope was introduced through the anus and   advanced to the the cecum, identified by                            appendiceal orifice and ileocecal valve. The                            colonoscopy was performed without difficulty. The                            patient tolerated the procedure well. The quality                            of the bowel preparation was excellent. The bowel                            preparation used was Miralax. The ileocecal valve,                            appendiceal orifice, and rectum were photographed. Scope In: 12:10:22 PM Scope Out: 12:20:36 PM Scope Withdrawal Time: 0 hours 7 minutes 4 seconds  Total Procedure Duration: 0 hours 10 minutes 14 seconds  Findings:                 The perianal and digital rectal examinations were                            normal.                           A diminutive polyp was found in the descending                            colon. The polyp was sessile. The polyp was removed                            with a cold snare. Resection and retrieval were                            complete. Verification of patient identification                            for the specimen was  done. Estimated blood loss was                            minimal.                           The exam was otherwise without abnormality on                            direct and retroflexion views. Complications:            No immediate complications. Estimated Blood Loss:     Estimated blood loss was minimal. Impression:               - One diminutive polyp in the descending colon,                            removed with a cold snare. Resected and retrieved.                           - The examination was otherwise normal on direct                            and retroflexion views. Recommendation:           - Patient has a contact number available for                            emergencies. The signs and symptoms of potential                            delayed complications were discussed with  the                            patient. Return to normal activities tomorrow.                            Written discharge instructions were provided to the                            patient.                           - Resume previous diet.                           - Continue present medications.                           - Repeat colonoscopy is recommended. The                            colonoscopy date will be determined after pathology                            results from today's exam become available for  review. Gatha Mayer, MD 07/14/2016 12:23:17 PM This report has been signed electronically.

## 2016-07-14 NOTE — Progress Notes (Signed)
Called to room to assist during endoscopic procedure.  Patient ID and intended procedure confirmed with present staff. Received instructions for my participation in the procedure from the performing physician.  

## 2016-07-15 ENCOUNTER — Telehealth: Payer: Self-pay | Admitting: *Deleted

## 2016-07-15 NOTE — Telephone Encounter (Signed)
No answer, box full, unable to leave message

## 2016-07-23 ENCOUNTER — Encounter: Payer: Self-pay | Admitting: Internal Medicine

## 2016-07-23 DIAGNOSIS — Z86010 Personal history of colonic polyps: Secondary | ICD-10-CM

## 2016-07-23 HISTORY — DX: Personal history of colonic polyps: Z86.010

## 2016-07-23 NOTE — Progress Notes (Signed)
Diminutive adenoma - repeat colonoscopy 2023

## 2016-10-16 ENCOUNTER — Ambulatory Visit (INDEPENDENT_AMBULATORY_CARE_PROVIDER_SITE_OTHER): Payer: BLUE CROSS/BLUE SHIELD

## 2016-10-16 ENCOUNTER — Encounter: Payer: Self-pay | Admitting: Podiatry

## 2016-10-16 ENCOUNTER — Ambulatory Visit (INDEPENDENT_AMBULATORY_CARE_PROVIDER_SITE_OTHER): Payer: BLUE CROSS/BLUE SHIELD | Admitting: Podiatry

## 2016-10-16 VITALS — BP 120/78 | HR 81 | Resp 16

## 2016-10-16 DIAGNOSIS — M779 Enthesopathy, unspecified: Secondary | ICD-10-CM

## 2016-10-16 DIAGNOSIS — M21619 Bunion of unspecified foot: Secondary | ICD-10-CM

## 2016-10-16 DIAGNOSIS — M722 Plantar fascial fibromatosis: Secondary | ICD-10-CM

## 2016-10-16 MED ORDER — DICLOFENAC SODIUM 75 MG PO TBEC: 75.0000 mg | DELAYED_RELEASE_TABLET | Freq: Two times a day (BID) | ORAL | 2 refills | Status: AC

## 2016-10-16 MED ORDER — TRIAMCINOLONE ACETONIDE 10 MG/ML IJ SUSP
10.0000 mg | Freq: Once | INTRAMUSCULAR | Status: AC
  Administered 2016-10-16: 10 mg

## 2016-10-16 NOTE — Progress Notes (Signed)
Subjective:    Patient ID: Leslie Wu, female   DOB: 51 y.o.   MRN: [id number]   HPI patient states she's had several month history of acute plan her fasciitis right and had history of this and number of years ago with injection treatment that was successful. She does wear orthotics but they have worn out and need to be replaced    Review of Systems  All other systems reviewed and are negative.       Objective:  Physical Exam  Constitutional: She appears well-developed and well-nourished.  Cardiovascular: Intact distal pulses.   Pulmonary/Chest: Effort normal.  Musculoskeletal: Normal range of motion.  Neurological: She is alert.  Skin: Skin is warm.  Nursing note and vitals reviewed.  neurovascular status intact muscle strength adequate range of motion was within normal limits with exquisite discomfort plantar fascia right at the insertional point of the tendon into the calcaneus with fluid buildup and moderate depression of the arch. Also noted to have structural bunion deformity of a moderate nature bilateral and does have mild discomfort left also area patient does not smoke and likes to be active     Assessment:   Acute plantar fasciitis right with mechanical dysfunction      Plan:    H&P x-rays reviewed and today I injected the plantar fascia right 3 mg Kenalog 5 mill grams Xylocaine and applied fascial brace placed on diclofenac 75 mg twice a day and educated on new orthotics. Patient be seen back 2 weeks and will be reevaluated or earlier if any issues should occur  X-rays indicate that there is spur formation with moderate structural bunion deformity right

## 2016-10-16 NOTE — Patient Instructions (Signed)

## 2016-11-02 ENCOUNTER — Other Ambulatory Visit: Payer: Self-pay | Admitting: Family Medicine

## 2016-11-02 DIAGNOSIS — M533 Sacrococcygeal disorders, not elsewhere classified: Secondary | ICD-10-CM

## 2016-11-04 ENCOUNTER — Encounter: Payer: Self-pay | Admitting: Podiatry

## 2016-11-04 ENCOUNTER — Ambulatory Visit (INDEPENDENT_AMBULATORY_CARE_PROVIDER_SITE_OTHER): Payer: BLUE CROSS/BLUE SHIELD | Admitting: Podiatry

## 2016-11-04 DIAGNOSIS — M722 Plantar fascial fibromatosis: Secondary | ICD-10-CM | POA: Diagnosis not present

## 2016-11-04 MED ORDER — TRIAMCINOLONE ACETONIDE 10 MG/ML IJ SUSP
10.0000 mg | Freq: Once | INTRAMUSCULAR | Status: AC
  Administered 2016-11-04: 10 mg

## 2016-11-04 NOTE — Progress Notes (Signed)
Subjective:    Patient ID: Leslie Wu, female   DOB: 51 y.o.   MRN: [id number]   HPI patient states my heel is still hurting and I do have a moderately depressed arch   ROS      Objective:  Physical ExamNeurovascular status intact with patient found to have depressed arch right with inflammation fluid around the medial band     Assessment:    Structural condition with mechanical inflammation of the right medial fascial band     Plan:  Reviewed long-term orthotics and scanned for customized orthotics to lift up the arch and went ahead and injected the fascia in the area of intense discomfort 3 mg Kenalog 5 mg Xylocaine

## 2016-11-10 ENCOUNTER — Other Ambulatory Visit: Payer: BLUE CROSS/BLUE SHIELD

## 2016-11-17 ENCOUNTER — Encounter: Payer: Self-pay | Admitting: Thoracic Surgery (Cardiothoracic Vascular Surgery)

## 2016-11-20 ENCOUNTER — Ambulatory Visit
Admission: RE | Admit: 2016-11-20 | Discharge: 2016-11-20 | Disposition: A | Discharge status: Home / Self Care | Payer: BLUE CROSS/BLUE SHIELD | Source: Ambulatory Visit | Attending: Family Medicine | Admitting: Family Medicine

## 2016-11-20 DIAGNOSIS — M533 Sacrococcygeal disorders, not elsewhere classified: Secondary | ICD-10-CM

## 2016-11-20 MED ORDER — IOPAMIDOL (ISOVUE-M 200) INJECTION 41%
1.0000 mL | Freq: Once | INTRAMUSCULAR | Status: AC
  Administered 2016-11-20: 1 mL via INTRA_ARTICULAR

## 2016-11-20 MED ORDER — METHYLPREDNISOLONE ACETATE 40 MG/ML INJ SUSP (RADIOLOG
120.0000 mg | Freq: Once | INTRAMUSCULAR | Status: AC
  Administered 2016-11-20: 120 mg via INTRA_ARTICULAR

## 2016-11-20 NOTE — Discharge Instructions (Signed)

## 2016-11-25 ENCOUNTER — Other Ambulatory Visit: Payer: BLUE CROSS/BLUE SHIELD | Admitting: Orthotics

## 2016-12-01 ENCOUNTER — Other Ambulatory Visit: Payer: BLUE CROSS/BLUE SHIELD

## 2016-12-03 ENCOUNTER — Other Ambulatory Visit: Payer: BLUE CROSS/BLUE SHIELD

## 2016-12-10 ENCOUNTER — Other Ambulatory Visit: Payer: BLUE CROSS/BLUE SHIELD

## 2016-12-16 ENCOUNTER — Other Ambulatory Visit: Payer: BLUE CROSS/BLUE SHIELD

## 2017-01-29 ENCOUNTER — Other Ambulatory Visit: Payer: Self-pay | Admitting: Family Medicine

## 2017-01-29 DIAGNOSIS — S39012S Strain of muscle, fascia and tendon of lower back, sequela: Secondary | ICD-10-CM

## 2017-01-29 DIAGNOSIS — R0789 Other chest pain: Secondary | ICD-10-CM

## 2017-02-01 ENCOUNTER — Ambulatory Visit
Admission: RE | Admit: 2017-02-01 | Discharge: 2017-02-01 | Disposition: A | Discharge status: Home / Self Care | Payer: BLUE CROSS/BLUE SHIELD | Source: Ambulatory Visit | Attending: Family Medicine | Admitting: Family Medicine

## 2017-02-01 DIAGNOSIS — R0789 Other chest pain: Secondary | ICD-10-CM

## 2017-02-09 ENCOUNTER — Ambulatory Visit
Admission: RE | Admit: 2017-02-09 | Discharge: 2017-02-09 | Disposition: A | Discharge status: Home / Self Care | Payer: BLUE CROSS/BLUE SHIELD | Source: Ambulatory Visit | Attending: Family Medicine | Admitting: Family Medicine

## 2017-02-09 DIAGNOSIS — S39012S Strain of muscle, fascia and tendon of lower back, sequela: Secondary | ICD-10-CM

## 2017-02-09 MED ORDER — METHYLPREDNISOLONE ACETATE 40 MG/ML INJ SUSP (RADIOLOG
120.0000 mg | Freq: Once | INTRAMUSCULAR | Status: AC
  Administered 2017-02-09: 120 mg via EPIDURAL

## 2017-02-09 MED ORDER — IOPAMIDOL (ISOVUE-M 200) INJECTION 41%
1.0000 mL | Freq: Once | INTRAMUSCULAR | Status: AC
  Administered 2017-02-09: 1 mL via EPIDURAL

## 2017-02-19 ENCOUNTER — Other Ambulatory Visit: Payer: Self-pay | Admitting: Family Medicine

## 2017-02-19 DIAGNOSIS — S39012D Strain of muscle, fascia and tendon of lower back, subsequent encounter: Secondary | ICD-10-CM

## 2017-03-16 ENCOUNTER — Ambulatory Visit
Admission: RE | Admit: 2017-03-16 | Discharge: 2017-03-16 | Disposition: A | Discharge status: Home / Self Care | Payer: BLUE CROSS/BLUE SHIELD | Source: Ambulatory Visit | Attending: Family Medicine | Admitting: Family Medicine

## 2017-03-16 DIAGNOSIS — S39012D Strain of muscle, fascia and tendon of lower back, subsequent encounter: Secondary | ICD-10-CM

## 2017-03-16 MED ORDER — IOPAMIDOL (ISOVUE-M 200) INJECTION 41%: 1.0000 mL | Freq: Once | INTRAMUSCULAR | Status: DC

## 2017-03-16 MED ORDER — METHYLPREDNISOLONE ACETATE 40 MG/ML INJ SUSP (RADIOLOG: 120.0000 mg | Freq: Once | INTRAMUSCULAR | Status: DC

## 2017-04-07 ENCOUNTER — Ambulatory Visit: Payer: BLUE CROSS/BLUE SHIELD | Admitting: Thoracic Surgery (Cardiothoracic Vascular Surgery)

## 2017-04-12 ENCOUNTER — Ambulatory Visit: Payer: BLUE CROSS/BLUE SHIELD | Admitting: Thoracic Surgery (Cardiothoracic Vascular Surgery)

## 2017-04-13 ENCOUNTER — Other Ambulatory Visit: Payer: Self-pay

## 2017-04-13 ENCOUNTER — Encounter: Payer: Self-pay | Admitting: Thoracic Surgery (Cardiothoracic Vascular Surgery)

## 2017-04-13 ENCOUNTER — Ambulatory Visit: Payer: BLUE CROSS/BLUE SHIELD | Admitting: Thoracic Surgery (Cardiothoracic Vascular Surgery)

## 2017-04-13 VITALS — BP 121/84 | HR 83 | Resp 18 | Ht 62.5 in | Wt 155.0 lb

## 2017-04-13 DIAGNOSIS — M8448XG Pathological fracture, other site, subsequent encounter for fracture with delayed healing: Secondary | ICD-10-CM | POA: Diagnosis not present
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
## 2017-06-03 ENCOUNTER — Other Ambulatory Visit: Payer: Self-pay | Admitting: Family Medicine

## 2017-06-03 DIAGNOSIS — S39012A Strain of muscle, fascia and tendon of lower back, initial encounter: Secondary | ICD-10-CM

## 2017-06-16 ENCOUNTER — Other Ambulatory Visit: Payer: Self-pay | Admitting: Family Medicine

## 2017-06-16 DIAGNOSIS — N6012 Diffuse cystic mastopathy of left breast: Secondary | ICD-10-CM

## 2017-06-18 ENCOUNTER — Other Ambulatory Visit: Payer: BLUE CROSS/BLUE SHIELD

## 2017-06-22 ENCOUNTER — Ambulatory Visit
Admission: RE | Admit: 2017-06-22 | Discharge: 2017-06-22 | Disposition: A | Discharge status: Home / Self Care | Payer: BLUE CROSS/BLUE SHIELD | Source: Ambulatory Visit | Attending: Family Medicine | Admitting: Family Medicine

## 2017-06-22 DIAGNOSIS — S39012A Strain of muscle, fascia and tendon of lower back, initial encounter: Secondary | ICD-10-CM

## 2017-06-22 MED ORDER — IOPAMIDOL (ISOVUE-M 200) INJECTION 41%
1.0000 mL | Freq: Once | INTRAMUSCULAR | Status: AC
  Administered 2017-06-22: 1 mL via EPIDURAL

## 2017-06-22 MED ORDER — METHYLPREDNISOLONE ACETATE 40 MG/ML INJ SUSP (RADIOLOG
120.0000 mg | Freq: Once | INTRAMUSCULAR | Status: AC
  Administered 2017-06-22: 120 mg via EPIDURAL

## 2017-06-23 ENCOUNTER — Ambulatory Visit
Admission: RE | Admit: 2017-06-23 | Discharge: 2017-06-23 | Disposition: A | Discharge status: Home / Self Care | Payer: BLUE CROSS/BLUE SHIELD | Source: Ambulatory Visit | Attending: Family Medicine | Admitting: Family Medicine

## 2017-06-23 ENCOUNTER — Other Ambulatory Visit: Payer: BLUE CROSS/BLUE SHIELD

## 2017-06-23 DIAGNOSIS — N6012 Diffuse cystic mastopathy of left breast: Secondary | ICD-10-CM

## 2017-07-14 ENCOUNTER — Ambulatory Visit
Admission: RE | Admit: 2017-07-14 | Discharge: 2017-07-14 | Disposition: A | Discharge status: Home / Self Care | Payer: BLUE CROSS/BLUE SHIELD | Source: Ambulatory Visit | Attending: Family Medicine | Admitting: Family Medicine

## 2017-07-14 ENCOUNTER — Other Ambulatory Visit: Payer: Self-pay | Admitting: Family Medicine

## 2017-07-14 DIAGNOSIS — S2220XB Unspecified fracture of sternum, initial encounter for open fracture: Secondary | ICD-10-CM

## 2017-09-24 ENCOUNTER — Other Ambulatory Visit: Payer: Self-pay | Admitting: Family Medicine

## 2017-09-24 DIAGNOSIS — S39012A Strain of muscle, fascia and tendon of lower back, initial encounter: Secondary | ICD-10-CM

## 2017-10-19 ENCOUNTER — Ambulatory Visit
Admission: RE | Admit: 2017-10-19 | Discharge: 2017-10-19 | Disposition: A | Discharge status: Home / Self Care | Payer: BLUE CROSS/BLUE SHIELD | Source: Ambulatory Visit | Attending: Family Medicine | Admitting: Family Medicine

## 2017-10-19 DIAGNOSIS — S39012A Strain of muscle, fascia and tendon of lower back, initial encounter: Secondary | ICD-10-CM

## 2017-10-19 MED ORDER — METHYLPREDNISOLONE ACETATE 40 MG/ML INJ SUSP (RADIOLOG
120.0000 mg | Freq: Once | INTRAMUSCULAR | Status: AC
  Administered 2017-10-19: 120 mg via EPIDURAL

## 2017-10-19 MED ORDER — IOPAMIDOL (ISOVUE-M 200) INJECTION 41%
1.0000 mL | Freq: Once | INTRAMUSCULAR | Status: AC
  Administered 2017-10-19: 1 mL via EPIDURAL

## 2017-10-19 NOTE — Discharge Instructions (Signed)
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
## 2018-03-08 ENCOUNTER — Other Ambulatory Visit: Payer: Self-pay | Admitting: Family Medicine

## 2018-03-08 DIAGNOSIS — M545 Low back pain, unspecified: Secondary | ICD-10-CM

## 2018-03-08 DIAGNOSIS — G8929 Other chronic pain: Secondary | ICD-10-CM

## 2018-03-18 ENCOUNTER — Other Ambulatory Visit: Payer: Self-pay

## 2018-03-18 ENCOUNTER — Ambulatory Visit
Admission: RE | Admit: 2018-03-18 | Discharge: 2018-03-18 | Disposition: A | Discharge status: Home / Self Care | Payer: BLUE CROSS/BLUE SHIELD | Source: Ambulatory Visit | Attending: Family Medicine | Admitting: Family Medicine

## 2018-03-18 DIAGNOSIS — M545 Low back pain, unspecified: Secondary | ICD-10-CM

## 2018-03-18 DIAGNOSIS — G8929 Other chronic pain: Secondary | ICD-10-CM

## 2018-03-18 MED ORDER — METHYLPREDNISOLONE ACETATE 40 MG/ML INJ SUSP (RADIOLOG
120.0000 mg | Freq: Once | INTRAMUSCULAR | Status: AC
  Administered 2018-03-18: 120 mg via EPIDURAL

## 2018-03-18 MED ORDER — IOPAMIDOL (ISOVUE-M 200) INJECTION 41%
1.0000 mL | Freq: Once | INTRAMUSCULAR | Status: AC
  Administered 2018-03-18: 1 mL via EPIDURAL

## 2018-05-05 ENCOUNTER — Other Ambulatory Visit: Payer: Self-pay | Admitting: Family Medicine

## 2018-05-05 DIAGNOSIS — M545 Low back pain, unspecified: Secondary | ICD-10-CM

## 2018-06-23 ENCOUNTER — Other Ambulatory Visit: Payer: Self-pay

## 2018-06-23 ENCOUNTER — Ambulatory Visit
Admission: RE | Admit: 2018-06-23 | Discharge: 2018-06-23 | Disposition: A | Discharge status: Home / Self Care | Payer: BLUE CROSS/BLUE SHIELD | Source: Ambulatory Visit | Attending: Family Medicine | Admitting: Family Medicine

## 2018-06-23 DIAGNOSIS — M545 Low back pain, unspecified: Secondary | ICD-10-CM

## 2018-06-23 MED ORDER — IOPAMIDOL (ISOVUE-M 200) INJECTION 41%
1.0000 mL | Freq: Once | INTRAMUSCULAR | Status: AC
  Administered 2018-06-23: 1 mL via EPIDURAL

## 2018-06-23 MED ORDER — METHYLPREDNISOLONE ACETATE 40 MG/ML INJ SUSP (RADIOLOG
120.0000 mg | Freq: Once | INTRAMUSCULAR | Status: AC
  Administered 2018-06-23: 15:00:00 120 mg via EPIDURAL

## 2018-08-29 ENCOUNTER — Other Ambulatory Visit: Payer: Self-pay | Admitting: Family Medicine

## 2018-10-14 ENCOUNTER — Other Ambulatory Visit: Payer: Self-pay | Admitting: Family Medicine

## 2018-11-02 DIAGNOSIS — M5416 Radiculopathy, lumbar region: Secondary | ICD-10-CM | POA: Insufficient documentation

## 2019-02-18 IMAGING — CT CT CHEST W/O CM
1 series · 16 of 33 positions shown, 20 images · non-contrast
Comparison: CT chest dated February 01, 2017.

CLINICAL DATA: Sternal fracture.  Evaluate healing.

EXAM:
CT CHEST WITHOUT CONTRAST
TECHNIQUE: Multidetector CT imaging of the chest was performed following the
standard protocol without IV contrast.

[Series 2: chest w/(date) · axial · 0.81mm/px · z∈[-376,-62]mm · 16 of 171 slices shown, 20 images]
[im 7/171  mediastinal]
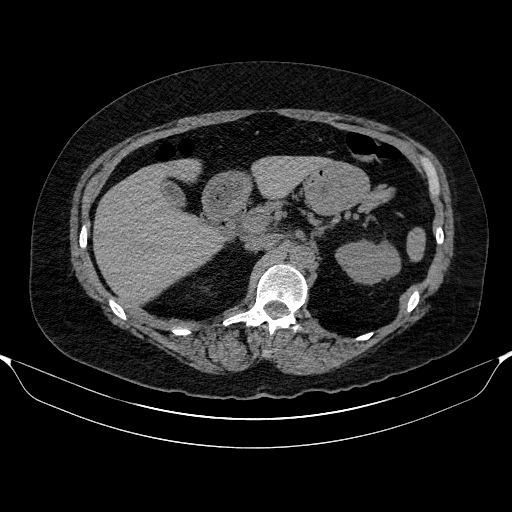
[im 7/171  lung]
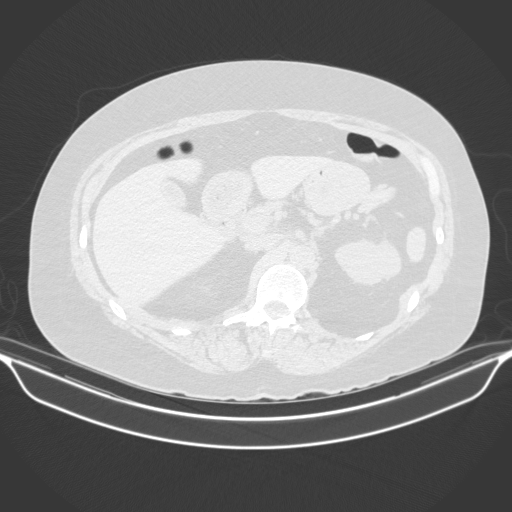
[im 19/171  lung]
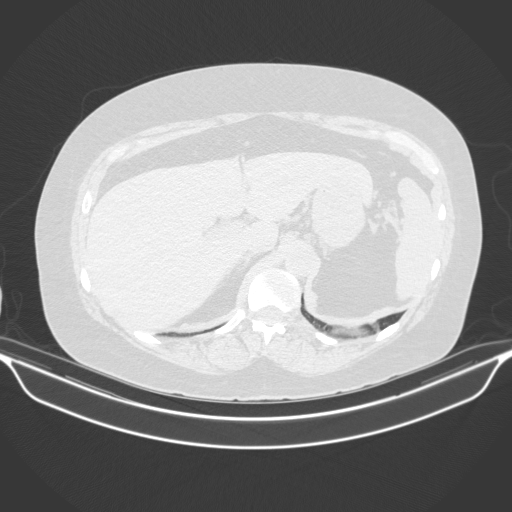
[im 32/171  lung]
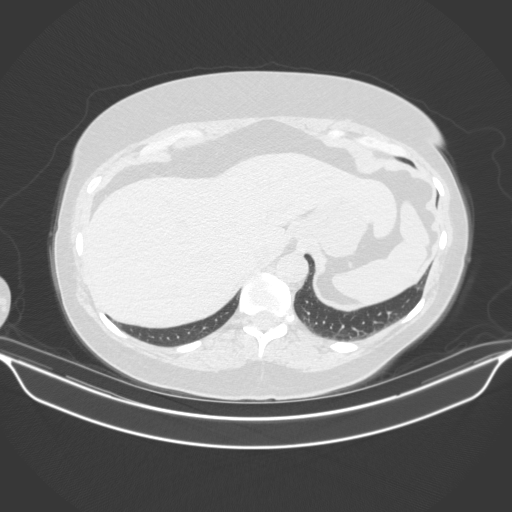
[im 38/171  lung]
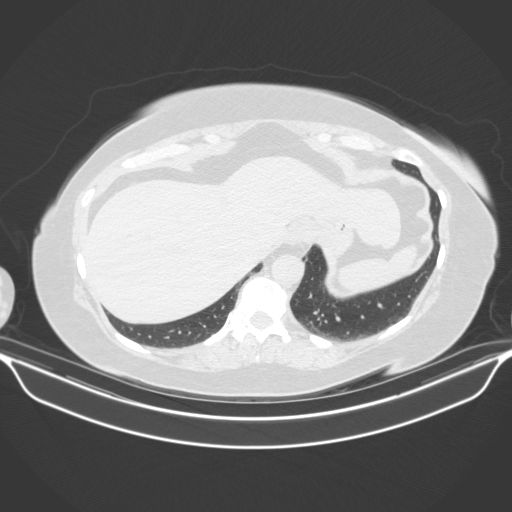
[im 51/171  mediastinal]
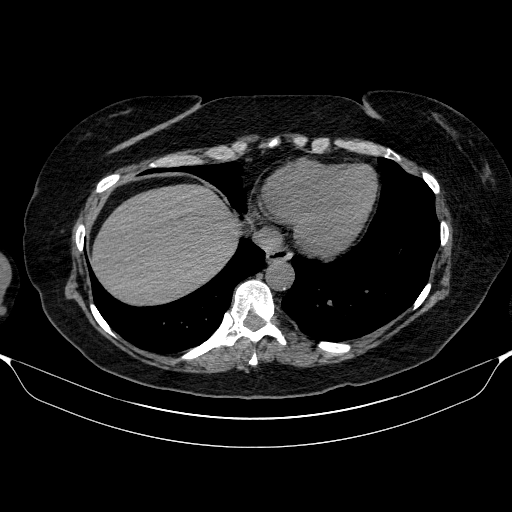
[im 51/171  lung]
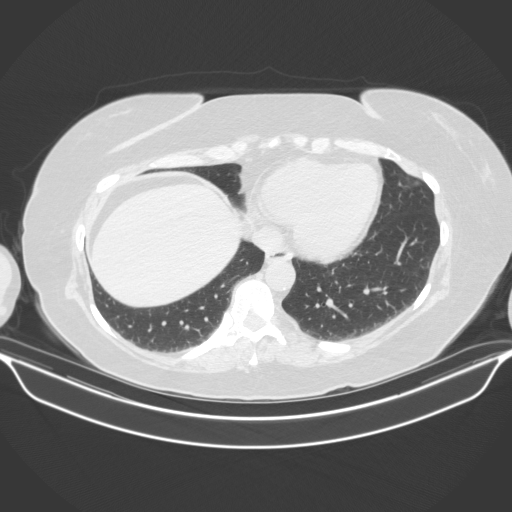
[im 63/171  lung]
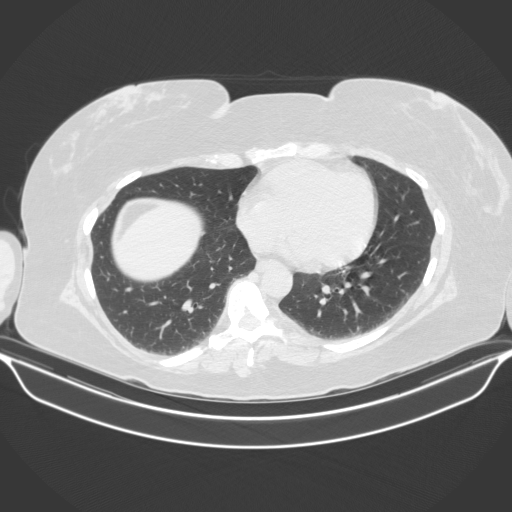
[im 70/171  lung]
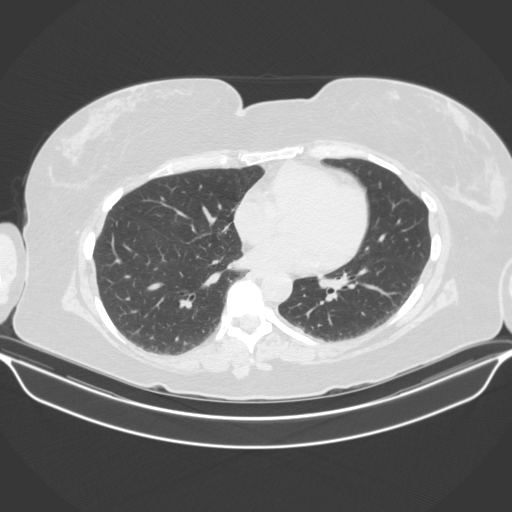
[im 82/171  lung]
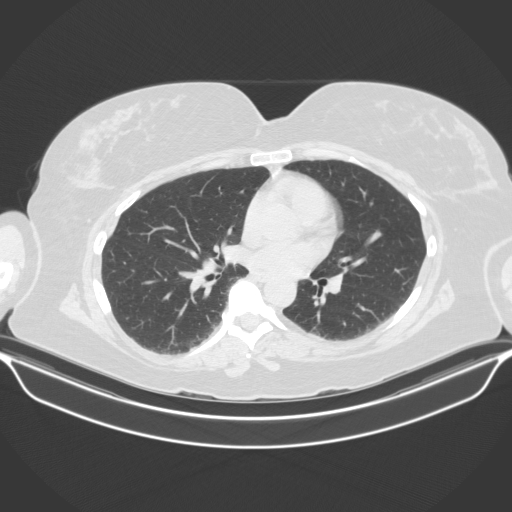
[im 91/171  mediastinal]
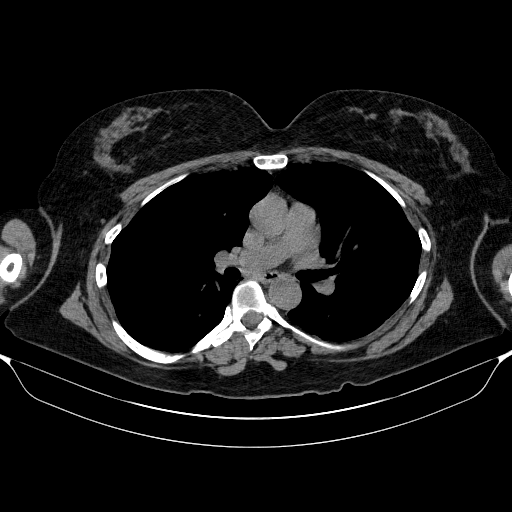
[im 91/171  lung]
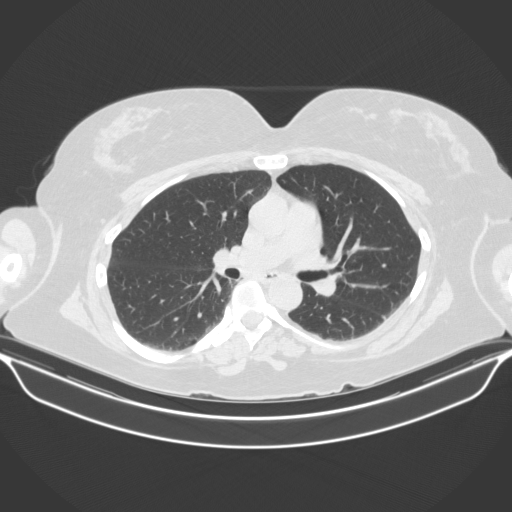
[im 101/171  lung]
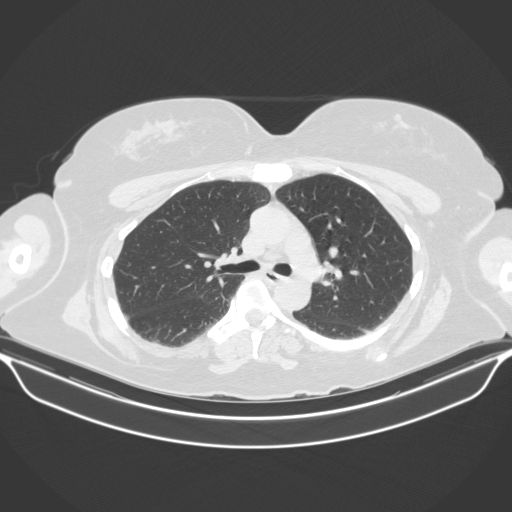
[im 108/171  lung]
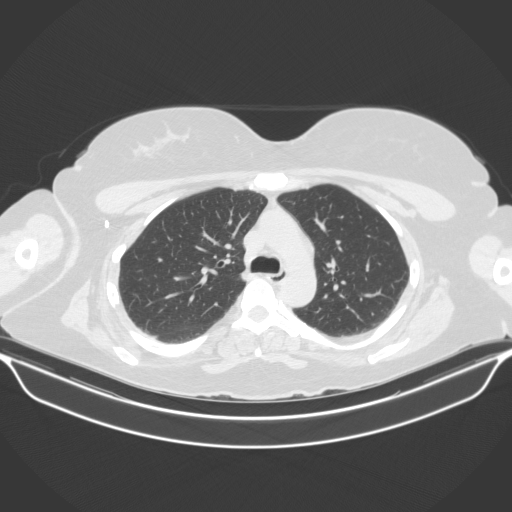
[im 120/171  lung]
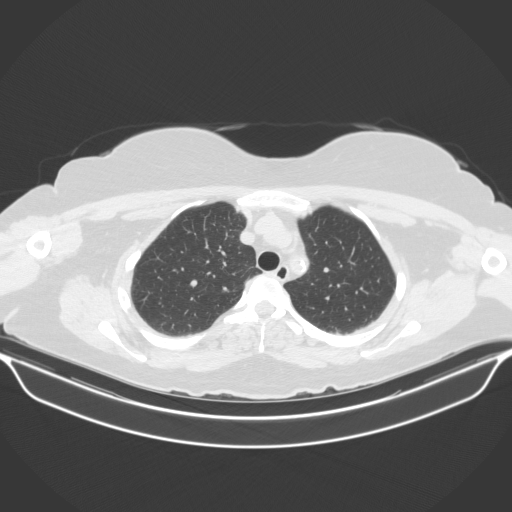
[im 133/171  mediastinal]
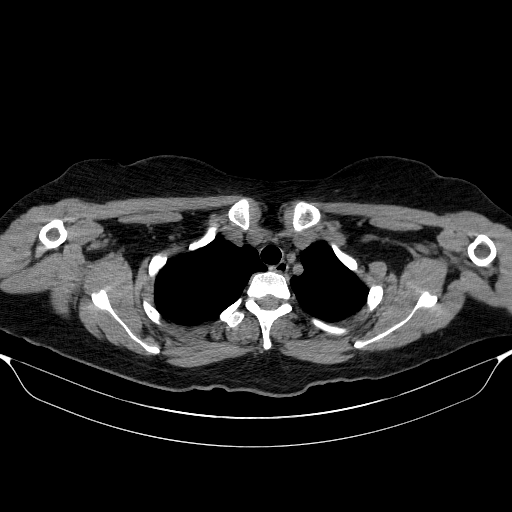
[im 133/171  lung]
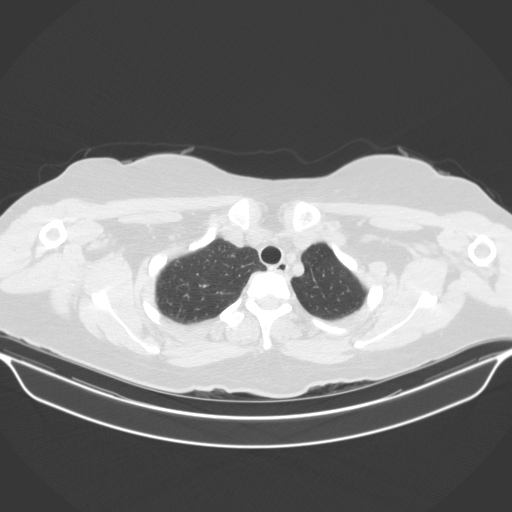
[im 139/171  lung]
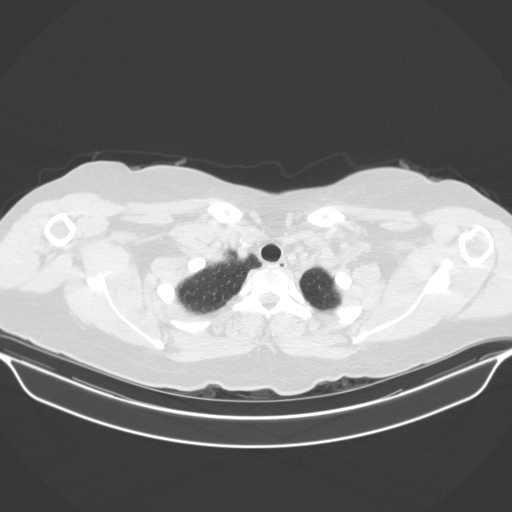
[im 152/171  lung]
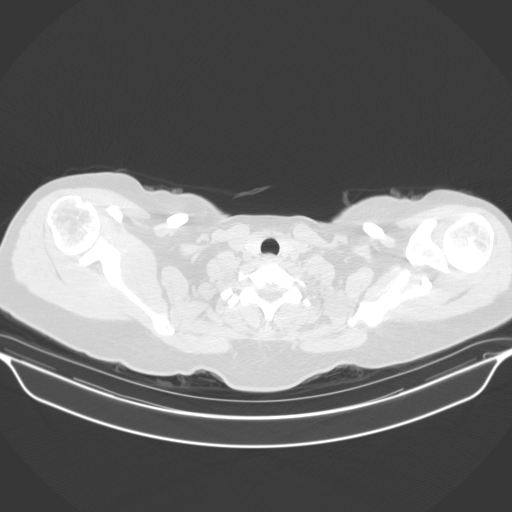
[im 164/171  lung]
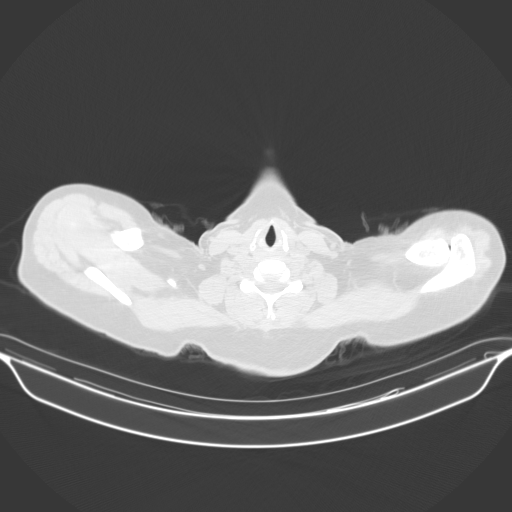

[16 of 33 positions shown; findings below may reference images not displayed]

FINDINGS: Cardiovascular: Normal heart size. No pericardial effusion. Normal
caliber thoracic aorta. Mild atherosclerotic calcifications of the
aortic arch.

Mediastinum/Nodes: No enlarged mediastinal or axillary lymph nodes.
Thyroid gland, trachea, and esophagus demonstrate no significant
findings.

Lungs/Pleura: No focal consolidation, pleural effusion, or
pneumothorax. No suspicious pulmonary nodule.

Upper Abdomen: No acute abnormality.

Musculoskeletal: Healed sternal fracture with unchanged intraosseous
lucency/cyst at the fracture site. No acute or significant osseous
findings.
IMPRESSION: 1. Unchanged healed sternal fracture.
2.  Aortic atherosclerosis ([id number]).

## 2019-03-17 ENCOUNTER — Other Ambulatory Visit: Payer: Self-pay | Admitting: Family Medicine

## 2019-03-17 DIAGNOSIS — N63 Unspecified lump in unspecified breast: Secondary | ICD-10-CM

## 2019-04-05 ENCOUNTER — Other Ambulatory Visit: Payer: BLUE CROSS/BLUE SHIELD

## 2019-04-06 ENCOUNTER — Other Ambulatory Visit: Payer: Self-pay | Admitting: Family Medicine

## 2019-04-06 DIAGNOSIS — M79605 Pain in left leg: Secondary | ICD-10-CM

## 2019-04-06 DIAGNOSIS — M79604 Pain in right leg: Secondary | ICD-10-CM

## 2019-04-11 ENCOUNTER — Inpatient Hospital Stay: Admission: RE | Admit: 2019-04-11 | Payer: 59 | Source: Ambulatory Visit

## 2019-04-21 ENCOUNTER — Ambulatory Visit
Admission: RE | Admit: 2019-04-21 | Discharge: 2019-04-21 | Disposition: A | Discharge status: Home / Self Care | Payer: 59 | Source: Ambulatory Visit | Attending: Family Medicine | Admitting: Family Medicine

## 2019-04-21 ENCOUNTER — Other Ambulatory Visit: Payer: Self-pay

## 2019-04-21 DIAGNOSIS — N63 Unspecified lump in unspecified breast: Secondary | ICD-10-CM

## 2019-04-25 ENCOUNTER — Ambulatory Visit
Admission: RE | Admit: 2019-04-25 | Discharge: 2019-04-25 | Disposition: A | Discharge status: Home / Self Care | Payer: 59 | Source: Ambulatory Visit | Attending: Family Medicine | Admitting: Family Medicine

## 2019-04-25 ENCOUNTER — Other Ambulatory Visit: Payer: Self-pay

## 2019-04-25 DIAGNOSIS — M79605 Pain in left leg: Secondary | ICD-10-CM

## 2019-04-25 DIAGNOSIS — M79604 Pain in right leg: Secondary | ICD-10-CM

## 2019-04-25 MED ORDER — METHYLPREDNISOLONE ACETATE 40 MG/ML INJ SUSP (RADIOLOG
120.0000 mg | Freq: Once | INTRAMUSCULAR | Status: AC
  Administered 2019-04-25: 12:00:00 120 mg via EPIDURAL

## 2019-04-25 MED ORDER — IOPAMIDOL (ISOVUE-M 200) INJECTION 41%
1.0000 mL | Freq: Once | INTRAMUSCULAR | Status: AC
  Administered 2019-04-25: 1 mL via EPIDURAL

## 2019-09-08 ENCOUNTER — Other Ambulatory Visit: Payer: Self-pay | Admitting: Family Medicine

## 2019-09-08 DIAGNOSIS — M48061 Spinal stenosis, lumbar region without neurogenic claudication: Secondary | ICD-10-CM

## 2019-09-18 ENCOUNTER — Ambulatory Visit
Admission: RE | Admit: 2019-09-18 | Discharge: 2019-09-18 | Disposition: A | Discharge status: Home / Self Care | Payer: 59 | Source: Ambulatory Visit | Attending: Family Medicine | Admitting: Family Medicine

## 2019-09-18 ENCOUNTER — Other Ambulatory Visit: Payer: Self-pay

## 2019-09-18 DIAGNOSIS — M48061 Spinal stenosis, lumbar region without neurogenic claudication: Secondary | ICD-10-CM

## 2019-09-18 MED ORDER — METHYLPREDNISOLONE ACETATE 40 MG/ML INJ SUSP (RADIOLOG
120.0000 mg | Freq: Once | INTRAMUSCULAR | Status: AC
  Administered 2019-09-18: 120 mg via EPIDURAL

## 2019-09-18 MED ORDER — IOPAMIDOL (ISOVUE-M 200) INJECTION 41%
1.0000 mL | Freq: Once | INTRAMUSCULAR | Status: AC
  Administered 2019-09-18: 1 mL via EPIDURAL

## 2019-12-14 ENCOUNTER — Telehealth: Payer: Self-pay

## 2019-12-14 NOTE — Telephone Encounter (Signed)
Patient called she stated she is calling about her referral to our office CB:[phone]

## 2019-12-15 ENCOUNTER — Telehealth: Payer: Self-pay | Admitting: Physical Medicine and Rehabilitation

## 2019-12-15 NOTE — Telephone Encounter (Signed)
Patient called requesting a call back from [facility] for an appt. Please call pt at [phone].

## 2019-12-15 NOTE — Telephone Encounter (Signed)
Referral from Dr. Sandi Mariscal. Called patient and left message #2 to schedule bilateral S1 vs L4 TF.

## 2019-12-18 ENCOUNTER — Telehealth: Payer: Self-pay | Admitting: Physical Medicine and Rehabilitation

## 2019-12-18 NOTE — Telephone Encounter (Signed)
Left message #3

## 2019-12-18 NOTE — Telephone Encounter (Signed)
Patient called and can be seen between dates 12/20 up until 01/06/2020. Please call patient at [phone]. Called patient and left message to advise that we do not have any appointments available during those dates.

## 2019-12-18 NOTE — Telephone Encounter (Signed)
Patient called and can be seen between dates 12/20 up until 01/06/2020. Please call patient at [phone].

## 2019-12-18 NOTE — Telephone Encounter (Signed)
See previous message

## 2019-12-19 NOTE — Telephone Encounter (Signed)
Called pt and LVM #1 

## 2019-12-20 ENCOUNTER — Telehealth: Payer: Self-pay | Admitting: Physical Medicine and Rehabilitation

## 2019-12-20 NOTE — Telephone Encounter (Signed)
Called pt snd LVM #2

## 2019-12-20 NOTE — Telephone Encounter (Signed)
Pt ws returning Shena's call  [phone]

## 2019-12-20 NOTE — Telephone Encounter (Signed)
Called pt and sch 01/15/20.

## 2020-01-15 ENCOUNTER — Encounter: Payer: Self-pay | Admitting: Physical Medicine and Rehabilitation

## 2020-01-15 ENCOUNTER — Ambulatory Visit (INDEPENDENT_AMBULATORY_CARE_PROVIDER_SITE_OTHER): Payer: 59 | Admitting: Physical Medicine and Rehabilitation

## 2020-01-15 ENCOUNTER — Ambulatory Visit: Payer: Self-pay

## 2020-01-15 ENCOUNTER — Other Ambulatory Visit: Payer: Self-pay

## 2020-01-15 VITALS — BP 129/85 | HR 81

## 2020-01-15 DIAGNOSIS — M5416 Radiculopathy, lumbar region: Secondary | ICD-10-CM | POA: Diagnosis not present

## 2020-01-15 DIAGNOSIS — Q762 Congenital spondylolisthesis: Secondary | ICD-10-CM

## 2020-01-15 DIAGNOSIS — M4316 Spondylolisthesis, lumbar region: Secondary | ICD-10-CM

## 2020-01-15 DIAGNOSIS — G894 Chronic pain syndrome: Secondary | ICD-10-CM | POA: Diagnosis not present

## 2020-01-15 MED ORDER — BETAMETHASONE SOD PHOS & ACET 6 (3-3) MG/ML IJ SUSP: 12.0000 mg | Freq: Once | INTRAMUSCULAR | Status: DC

## 2020-01-15 MED ORDER — METHYLPREDNISOLONE ACETATE 80 MG/ML IJ SUSP: 80.0000 mg | Freq: Once | INTRAMUSCULAR | Status: DC

## 2020-01-15 NOTE — Progress Notes (Signed)
Numeric Pain Rating Scale and Functional Assessment Average Pain 8   In the last MONTH (on 0-10 scale) has pain interfered with the following?  1. General activity like being  able to carry out your everyday physical activities such as walking, climbing stairs, carrying groceries, or moving a chair?  Rating(10)    +Driver, -BT, -Dye Allergies. 

## 2020-01-30 ENCOUNTER — Encounter: Payer: Self-pay | Admitting: Physical Medicine and Rehabilitation

## 2020-01-30 ENCOUNTER — Telehealth: Payer: Self-pay | Admitting: Physical Medicine and Rehabilitation

## 2020-01-30 NOTE — Procedures (Signed)
S1 Lumbosacral Transforaminal Epidural Steroid Injection - Sub-Pedicular Approach with Fluoroscopic Guidance   Patient: Leslie Wu      Date of Birth: 07-30-1965 MRN: [id number] PCP: Derinda Late, MD      Visit Date: 01/15/2020   Universal Protocol:    Date/Time: 01/25/228:21 AM  Consent Given By: the patient  Position:  PRONE  Additional Comments: Vital signs were monitored before and after the procedure. Patient was prepped and draped in the usual sterile fashion. The correct patient, procedure, and site was verified.   Injection Procedure Details:  Procedure Site One Meds Administered:  Meds ordered this encounter  Medications  . DISCONTD: betamethasone acetate-betamethasone sodium phosphate (CELESTONE) injection 12 mg  . DISCONTD: methylPREDNISolone acetate (DEPO-MEDROL) injection 80 mg    Laterality: Left  Location/Site:  S1 Foramen   Needle size: 22 ga.  Needle type: Spinal  Needle Placement: Transforaminal  Findings:   -Comments: Excellent flow of contrast along the nerve, nerve root and into the epidural space.  Epidurogram: Contrast epidurogram showed no nerve root cut off or restricted flow pattern.  Procedure Details: After squaring off the sacral end-plate to get a true AP view, the C-arm was positioned so that the best possible view of the S1 foramen was visualized. The soft tissues overlying this structure were infiltrated with 2-3 ml. of 1% Lidocaine without Epinephrine.    The spinal needle was inserted toward the target using a "trajectory" view along the fluoroscope beam.  Under AP and lateral visualization, the needle was advanced so it did not puncture dura. Biplanar projections were used to confirm position. Aspiration was confirmed to be negative for CSF and/or blood. A 1-2 ml. volume of Isovue-250 was injected and flow of contrast was noted at each level. Radiographs were obtained for documentation purposes.   After attaining the  desired flow of contrast documented above, a 0.5 to 1.0 ml test dose of 0.25% Marcaine was injected into each respective transforaminal space.  The patient was observed for 90 seconds post injection.  After no sensory deficits were reported, and normal lower extremity motor function was noted,   the above injectate was administered so that equal amounts of the injectate were placed at each foramen (level) into the transforaminal epidural space.   Additional Comments:  The patient tolerated the procedure well Dressing: Band-Aid with 2 x 2 sterile gauze    Post-procedure details: Patient was observed during the procedure. Post-procedure instructions were reviewed.  Patient left the clinic in stable condition.
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
## 2020-01-30 NOTE — Telephone Encounter (Signed)
01/15/20 ov note faxed to Dr. Sandi Mariscal, referring office [phone]

## 2020-02-09 ENCOUNTER — Other Ambulatory Visit: Payer: Self-pay

## 2020-02-09 ENCOUNTER — Ambulatory Visit (INDEPENDENT_AMBULATORY_CARE_PROVIDER_SITE_OTHER): Payer: BLUE CROSS/BLUE SHIELD | Admitting: Family Medicine

## 2020-02-09 ENCOUNTER — Ambulatory Visit: Payer: Self-pay

## 2020-02-09 ENCOUNTER — Encounter: Payer: Self-pay | Admitting: Family Medicine

## 2020-02-09 DIAGNOSIS — M25472 Effusion, left ankle: Secondary | ICD-10-CM | POA: Diagnosis not present

## 2020-02-09 NOTE — Progress Notes (Signed)
Office Visit Note   Patient: Leslie Wu           Date of Birth: 21-Oct-1965           MRN: [id number] Visit Date: 02/09/2020 Requested by: Derinda Late, MD [address] [place],  [place] [address] PCP: Derinda Late, MD  Subjective: Chief Complaint  Patient presents with  . Left Ankle - Pain    Pain and swelling in the left ankle x 6 months. NKI. The right one is now starting to swell. The pain in the left ankle is mostly lateral. Pain is continual.    HPI: She is here with left ankle swelling.  Symptoms started about 6 months ago, no injury.  Gradual onset of swelling and pain.  She has tried ibuprofen but it did not help.  No change in her footwear.  The right ankle is starting to swell a little bit but it does not hurt.  She cannot think of anything that would have caused this.  No personal or family history of gout or rheumatologic disease to her knowledge.                ROS:   All other systems were reviewed and are negative.  Objective: Vital Signs: There were no vitals taken for this visit.  Physical Exam:  General:  Alert and oriented, in no acute distress. Pulm:  Breathing unlabored. Psy:  Normal mood, congruent affect. Skin: No erythema Left ankle: She is swollen laterally along the peroneal tendons.  She has slight pain with eversion against resistance but her strength is normal, no obvious subluxation of the tendons.  She is tender to palpation on the posterior aspect of the lateral malleolus.   Imaging: US Guided Needle Placement  Result Date: 02/09/2020 Limited diagnostic ultrasound of the left ankle reveals significant tenosynovitis of the peroneal tendons with fluid in the sheath.  No hyperemia on power Doppler imaging.  I question whether she might have a peroneus brevis split tear although I was not able to visualize any herniation into the tendon on dynamic imaging. Procedure: Ultrasound-guided peroneal tendon sheath injection: After sterile  prep with Betadine, injected 2 cc 0.25% bupivacaine and 3 mg betamethasone without complication.   Assessment & Plan: 1.  Left ankle swelling due to peroneal tenosynovitis, question peroneus brevis split tear.  Cannot rule out inflammatory condition. -Discussed with her and she wants to try an injection.  If this does not help, then we will consider additional lab testing and possibly MRI imaging.     Procedures: No procedures performed        PMFS History: Patient Active Problem List   Diagnosis Date Noted  . Lumbar radiculopathy 11/02/2018  . Hx of adenomatous polyp of colon 07/23/2016   Past Medical History:  Diagnosis Date  . Allergy   . Anxiety   . Cancer ([place]) 2005   melanoma right arm  . Closed fracture of body of sternum 12/12/2015  . Depression   . GERD (gastroesophageal reflux disease)   . Hx of adenomatous polyp of colon 07/23/2016  . Hyperlipidemia     Family History  Problem Relation Age of Onset  . Breast cancer Neg Hx   . Colon cancer Neg Hx   . Esophageal cancer Neg Hx   . Rectal cancer Neg Hx   . Stomach cancer Neg Hx     Past Surgical History:  Procedure Laterality Date  . BREAST BIOPSY  2011   [place]  .  BREAST BIOPSY Left 04/2016   benign  . MELANOMA EXCISION Right 2005   arm   Social History   Occupational History  . Not on file  Tobacco Use  . Smoking status: Never Smoker  . Smokeless tobacco: Never Used  Substance and Sexual Activity  . Alcohol use: Yes    Comment: only occasionally  . Drug use: No  . Sexual activity: Yes

## 2021-09-22 ENCOUNTER — Encounter: Payer: Self-pay | Admitting: Internal Medicine
